# Patient Record
Sex: Female | Born: 1995 | Race: White | Hispanic: No | Marital: Single | Smoking: Never smoker
Health system: Southern US, Community
[De-identification: ages and names within clinical notes are randomized; demographics above are authoritative.]

## PROBLEM LIST (undated history)

## (undated) HISTORY — PX: BREAST REDUCTION SURGERY: SHX8

---

## 2016-12-17 ENCOUNTER — Encounter: Payer: Self-pay | Admitting: Sports Medicine

## 2016-12-17 ENCOUNTER — Ambulatory Visit
Admission: RE | Admit: 2016-12-17 | Discharge: 2016-12-17 | Disposition: A | Discharge status: Home / Self Care | Payer: BLUE CROSS/BLUE SHIELD | Source: Ambulatory Visit | Attending: Sports Medicine | Admitting: Sports Medicine

## 2016-12-17 ENCOUNTER — Ambulatory Visit (INDEPENDENT_AMBULATORY_CARE_PROVIDER_SITE_OTHER): Payer: BLUE CROSS/BLUE SHIELD | Admitting: Sports Medicine

## 2016-12-17 VITALS — BP 100/74 | Ht 64.0 in | Wt 150.0 lb

## 2016-12-17 DIAGNOSIS — R202 Paresthesia of skin: Secondary | ICD-10-CM | POA: Diagnosis not present

## 2016-12-17 DIAGNOSIS — M25562 Pain in left knee: Secondary | ICD-10-CM

## 2016-12-17 DIAGNOSIS — M25561 Pain in right knee: Secondary | ICD-10-CM

## 2016-12-17 DIAGNOSIS — R200 Anesthesia of skin: Secondary | ICD-10-CM

## 2016-12-17 NOTE — Progress Notes (Signed)
   Subjective:    Kayla Phelps - 21 y.o. female MRN [id number]  Date of birth: 10/20/1995  CC: Bilateral leg pain  HPI:  Kayla Phelps is a 21 y/o female complaining of bilateral leg pain that has worsened over the past three months. She states that she experiences pain on her shins after prolonged walking or running. She endorses weakness, numbness, feeling cold, and cramping. Her symptoms alleviate with rest. She has tried PT, ice, and stretching without any significant relief. Recently over the past week, she also noticed numbness and tingling in her arms bilaterally while running. She has underwent significant workup with Dr. Fredderick SeveranceJohn Tipton and was evaluated and treated for shin splints and chronic compartment syndrome with no benefit. She used to run cross country in high school and currently runs about two miles 5 to 6 days a week and would like to continue to do that. She is here today with her mom.  ROS: Denies fevers, chills, or weight loss. Negative except per HPI.  PMH: None Past Surgical Hx: None Social Hx: [employer] at [employer]    Objective:   Physical Exam BP 100/74   Ht 5\' 4"  (1.626 m)   Wt 150 lb (68 kg)   LMP 12/10/2016   BMI 25.75 kg/m  Gen: NAD, alert, cooperative with exam, well-appearing Skin: no rashes, normal turgor Psych: good insight, alert and oriented Neuro: CN II -XII intact grossly; Strength 5/5 throughout; Sensation intact grossly; +2 reflexes bilaterally Knee: No erythema, effusion or obvious bony deformities;Mild TTP over anterior shin bilaterally; No patellar or joint line tenderness; FROM upon flexion and extension; ACL, PCL, MCL, and LCL intact; Negative McMurray's and Thessaly's tests; normal strength in quadriceps and hamstrings Foot: Pes cavus noted; mild pronation while running  Assessment & Plan:  Kayla Phelps is a 21 y/o female complaining of worsening bilateral leg pain.  Her symptoms do not fit a particular disease process or etiology  and workup for shin splints and compartment syndrome has been negative. She tends to pronate while jogging and has pes cavus feet so will try placing scaphoid pads to see if there is any improvement in her symptoms. I recommend getting an L Spine Xray and then based on the findings will consider MRI since she neurological symptoms of numbness and tingling.  -Scaphoid pad insertions -L Spin Xray; possible MRI if necessary -F/u in 2-3 weeks  I personally was present and performed or re-performed the history, physical exam and medical decision-making activities of this service and have verified that the service and findings are accurately documented in the student's note. Etiology of this patient's symptoms is straightforward. Her symptoms certainly sound neurological. Her x-rays of her lumbar spine are normal. She has had 2 months of physical therapy without improvement in symptoms. I think we should get an MRI of her lumbar spine specifically to rule out significant spinal stenosis which may be causing her symptoms. Follow-up after that study to delineate further treatment. We may need to elicit the input of neurology at some point as well especially if her upper extremity symptoms worsen.

## 2016-12-22 NOTE — Addendum Note (Signed)
Addended by: Annita BrodMOORE, Alaa Eyerman C on: 12/22/2016 02:10 PM   Modules accepted: Orders

## 2016-12-31 ENCOUNTER — Other Ambulatory Visit: Payer: BLUE CROSS/BLUE SHIELD

## 2017-01-09 ENCOUNTER — Ambulatory Visit
Admission: RE | Admit: 2017-01-09 | Discharge: 2017-01-09 | Disposition: A | Discharge status: Home / Self Care | Payer: BLUE CROSS/BLUE SHIELD | Source: Ambulatory Visit | Attending: Sports Medicine | Admitting: Sports Medicine

## 2017-01-09 DIAGNOSIS — R200 Anesthesia of skin: Secondary | ICD-10-CM

## 2017-01-09 DIAGNOSIS — R202 Paresthesia of skin: Principal | ICD-10-CM

## 2017-01-21 ENCOUNTER — Telehealth: Payer: Self-pay | Admitting: Sports Medicine

## 2017-01-21 NOTE — Telephone Encounter (Addendum)
-----   Message from Annita BrodNeeton C Moore, [place] sent at 01/19/2017 11:21 AM EDT ----- Regarding: FW: mri results   ----- Message ----- From: Lizbeth Barkeresi, Melanie L Sent: 01/19/2017  10:10 AM To: Annita BrodNeeton C Moore, CMA Subject: mri results                                    Pt had mri on 6/30  I spoke with the patient on the phone yesterday after reviewing her MRI of her lumbar spine. She has some mild degenerative changes but does not have any evidence of significant spinal stenosis to explain her bilateral lower extremity numbness and pain with running. I think we should go ahead and rule out exercise induced compartment syndrome. I will refer her to Dr. Dion SaucierLandau for this. If that study is unremarkable then I would consider referral to vascular to rule out a possible exercise induced vascular occlusion.

## 2017-01-21 NOTE — Telephone Encounter (Signed)
Spoke with Diannia RuderKara from Dr Shelba FlakeLandau's office and she will call patient to set up compartment testing time and date

## 2018-02-08 IMAGING — MR MR LUMBAR SPINE W/O CM
4 of 5 series · 27 of 48 positions shown · non-contrast
Comparison: Lumbar radiographs 12/17/2016

CLINICAL DATA: 21-year-old female with 4 months of severe pain
radiating to the bilateral legs below the knees. Numbness and
tingling in both legs. Radicular symptoms.

EXAM:
MRI LUMBAR SPINE WITHOUT CONTRAST
TECHNIQUE: Multiplanar, multisequence MR imaging of the lumbar spine was
performed. No intravenous contrast was administered.

[Series 3: T2 · sagittal · 4.0mm · 0.55mm/px · 5 of 13 slices shown (1 of 2)]
[im 1/13]
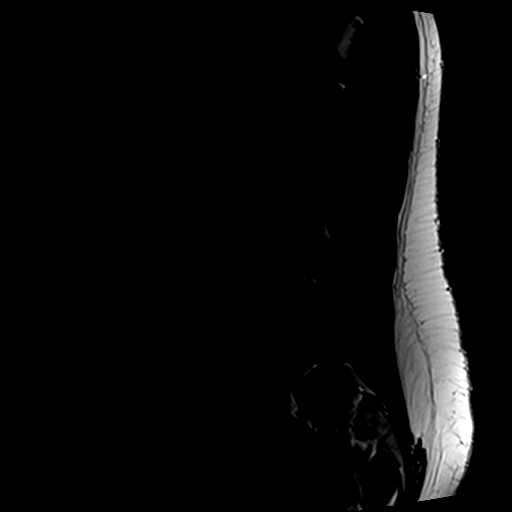
[im 4/13]
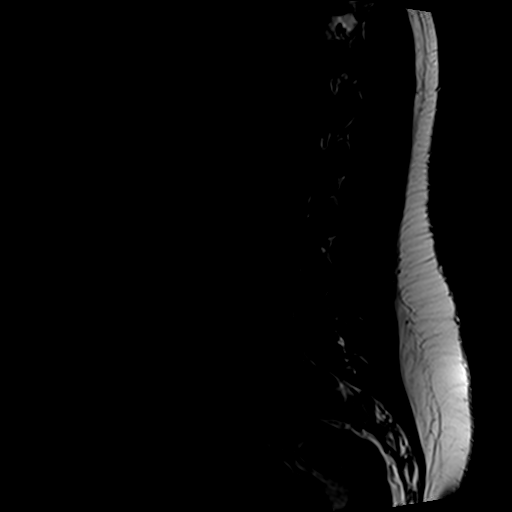
[im 7/13]
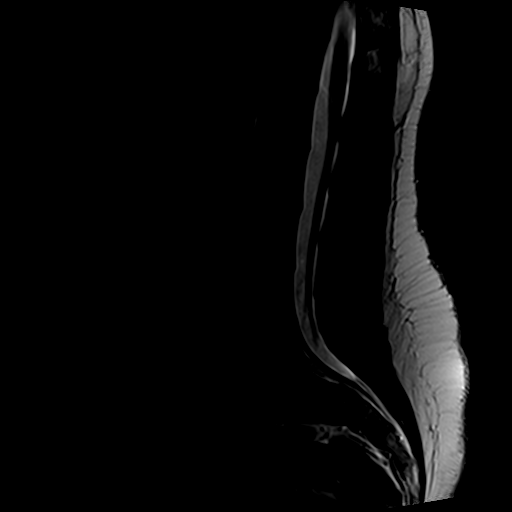
[im 10/13]
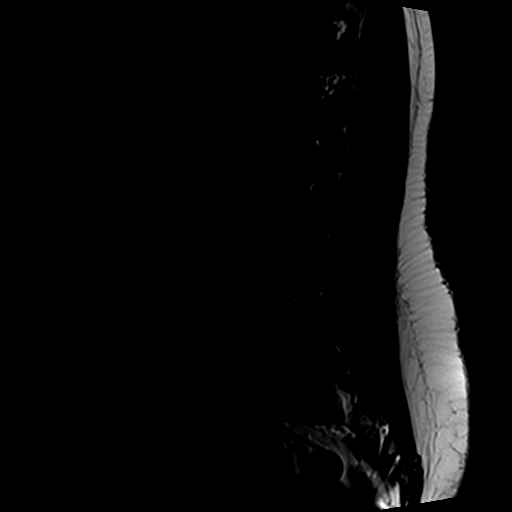
[im 13/13]
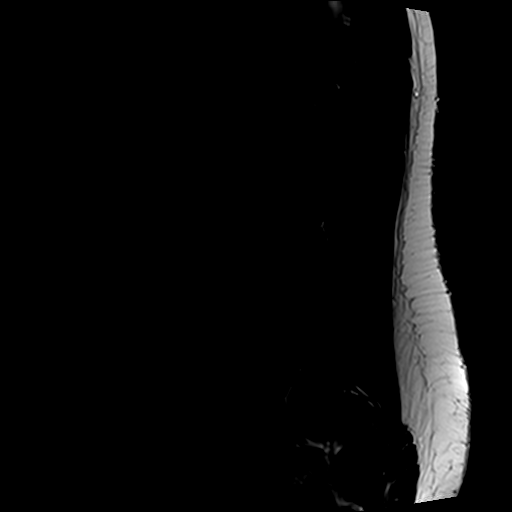

[Series 4: T1 · sagittal · 4.0mm · 0.55mm/px · 5 of 13 slices shown (1 of 2)]
[im 1/13]
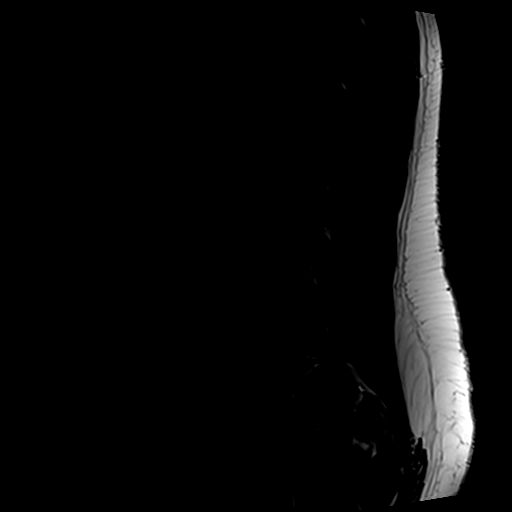
[im 4/13]
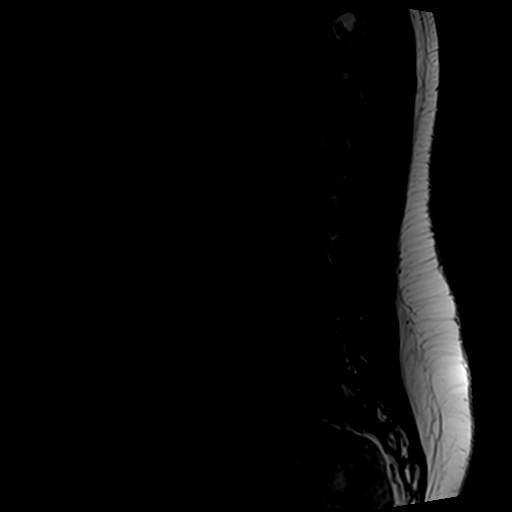
[im 7/13]
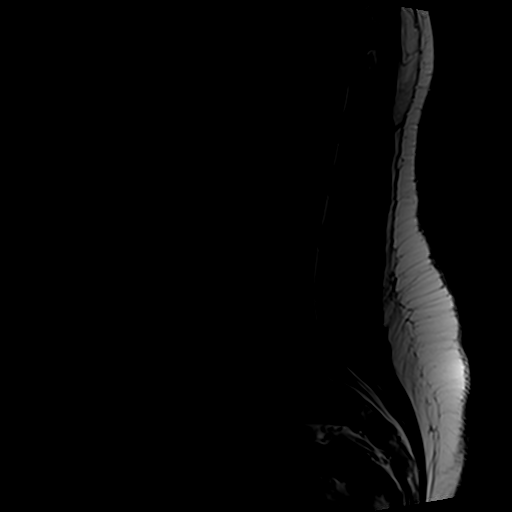
[im 10/13]
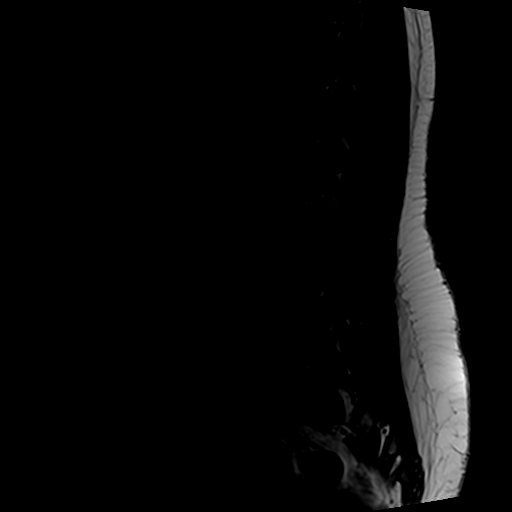
[im 13/13]
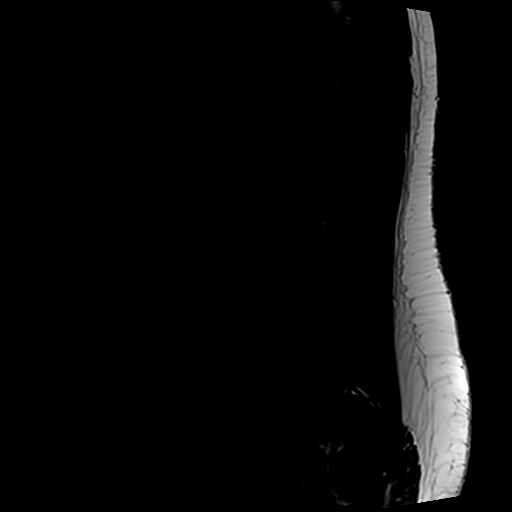

[Series 6: T2 · axial · 4.0mm · 0.70mm/px · z∈[-69,+120]mm · 10 of 37 slices shown (2 of 2)]
[im 3/37]
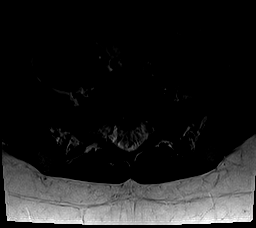
[im 5/37]
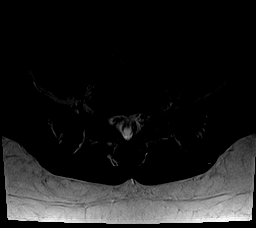
[im 8/37]
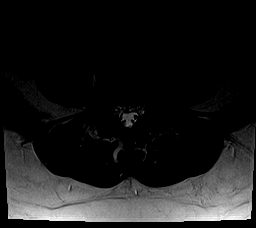
[im 13/37]
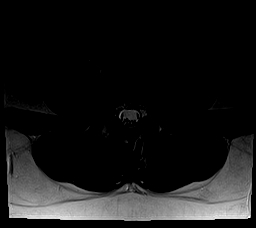
[im 17/37]
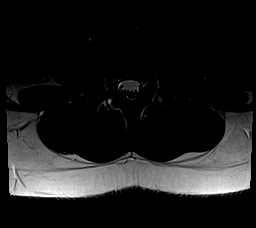
[im 20/37]
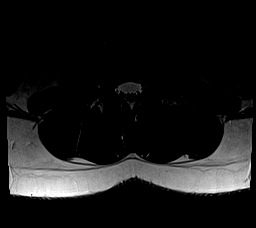
[im 22/37]
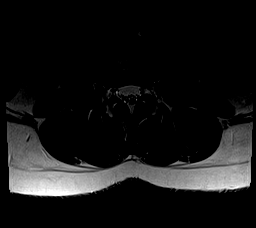
[im 27/37]
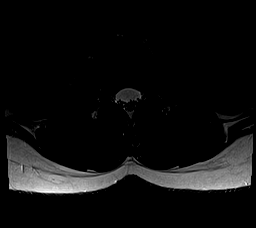
[im 32/37]
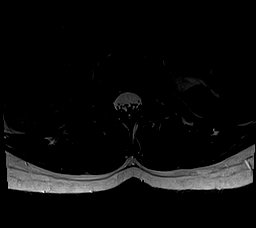
[im 37/37]
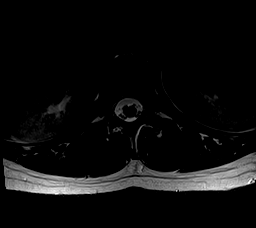

[Series 7: T1 · axial · 4.0mm · 0.35mm/px · z∈[-69,+95]mm · 7 of 37 slices shown (2 of 2)]
[im 3/37]
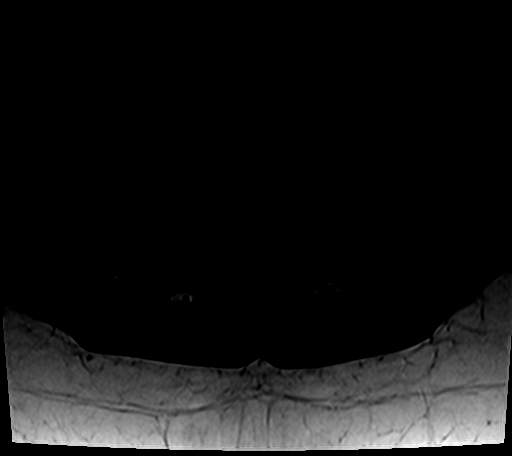
[im 5/37]
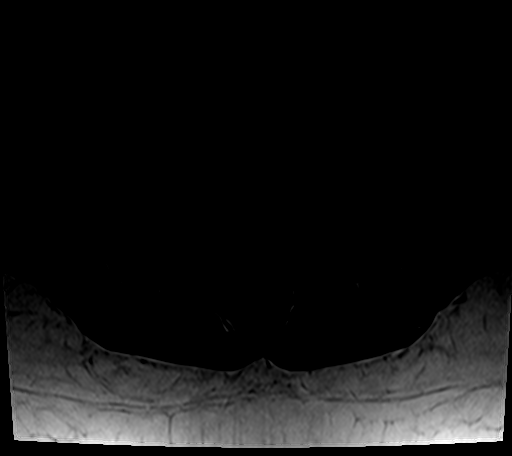
[im 8/37]
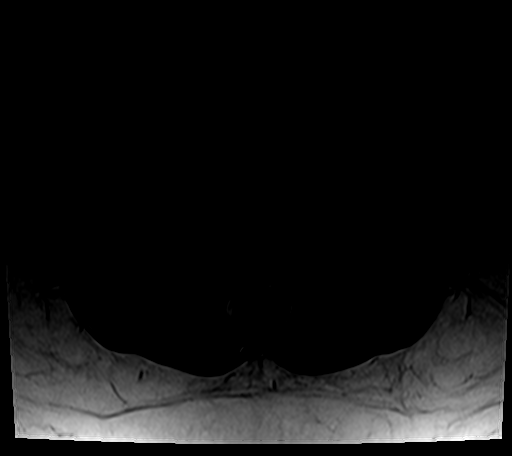
[im 13/37]
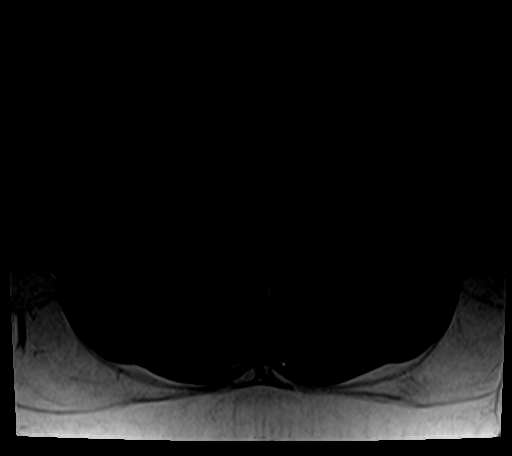
[im 17/37]
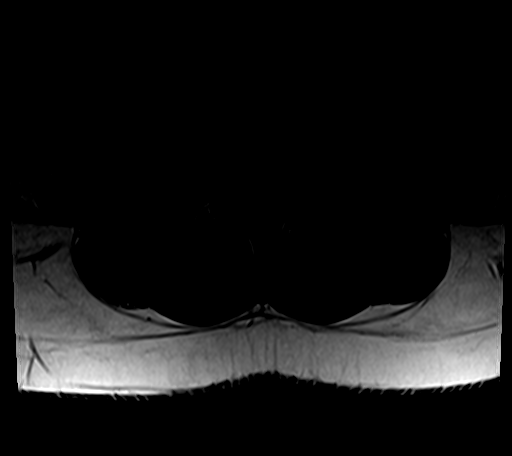
[im 20/37]
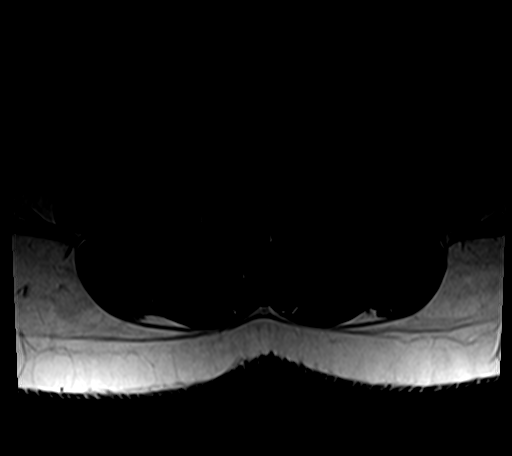
[im 32/37]
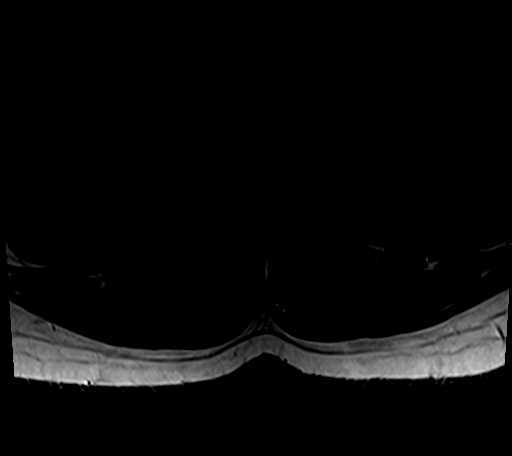

[27 of 48 positions shown; findings below may reference images not displayed]

FINDINGS: Segmentation:  Normal as seen on the comparison radiographs.

Alignment: Stable and largely normal vertebral height and alignment.
There is mildly exaggerated lordosis at L5-S1.

Vertebrae: No marrow edema or evidence of acute osseous abnormality.
Visualized bone marrow signal is within normal limits. Intact
visible sacrum and SI joints.

Conus medullaris: Extends to the T12-L1 level and appears normal.

Paraspinal and other soft tissues: Visualized abdominal viscera and
paraspinal soft tissues are within normal limits.

Disc levels:

T11-T12:  Negative.

T12-L1:  Negative.

L1-L2:  Negative.

L2-L3:  Negative.

L3-L4:  Negative aside from borderline to mild facet hypertrophy.

L4-L5: Subtle right paracentral disc protrusion (series 6, image 27
and series 3, image 6). Mild facet and ligament flavum hypertrophy.
No spinal stenosis. No lateral recess or foraminal stenosis.

L5-S1: Mild facet hypertrophy greater on the right. Mild epidural
lipomatosis. No stenosis.
IMPRESSION: 1. Isolated mild lumbar disc degeneration at L4-L5. There is a
subtle right paracentral disc herniation which might be a source for
L5 radiculitis, but there is no associated direct neural impingement
or spinal stenosis.
2. Mild degenerative lumbar facet hypertrophy at L4-L5 and L5-S1.

## 2019-09-05 ENCOUNTER — Encounter (HOSPITAL_COMMUNITY): Payer: Self-pay | Admitting: Family Medicine

## 2019-09-05 ENCOUNTER — Other Ambulatory Visit: Payer: Self-pay

## 2019-09-05 ENCOUNTER — Emergency Department (HOSPITAL_COMMUNITY)
Admission: EM | Admit: 2019-09-05 | Discharge: 2019-09-05 | Disposition: A | Discharge status: Home / Self Care | Payer: BC Managed Care – PPO | Attending: Emergency Medicine | Admitting: Emergency Medicine

## 2019-09-05 DIAGNOSIS — M542 Cervicalgia: Secondary | ICD-10-CM | POA: Diagnosis present

## 2019-09-05 DIAGNOSIS — M62838 Other muscle spasm: Secondary | ICD-10-CM | POA: Diagnosis not present

## 2019-09-05 MED ORDER — IBUPROFEN 600 MG PO TABS: 600.0000 mg | ORAL_TABLET | Freq: Four times a day (QID) | ORAL | 0 refills | Status: AC | PRN

## 2019-09-05 MED ORDER — IBUPROFEN 200 MG PO TABS
600.0000 mg | ORAL_TABLET | Freq: Once | ORAL | Status: AC
  Administered 2019-09-05: 600 mg via ORAL
  Filled 2019-09-05: qty 3

## 2019-09-05 MED ORDER — METHOCARBAMOL 500 MG PO TABS: 500.0000 mg | ORAL_TABLET | Freq: Two times a day (BID) | ORAL | 0 refills | Status: AC

## 2019-09-05 MED ORDER — LIDOCAINE 5 % EX PTCH: 1.0000 | MEDICATED_PATCH | CUTANEOUS | 0 refills | Status: AC

## 2019-09-05 MED ORDER — DIAZEPAM 5 MG PO TABS
5.0000 mg | ORAL_TABLET | Freq: Once | ORAL | Status: AC
  Administered 2019-09-05: 5 mg via ORAL
  Filled 2019-09-05: qty 1

## 2019-09-05 NOTE — ED Triage Notes (Signed)
Patient is complaining of left neck pain with a "pop" while she was sleeping last night. Also, complains of pain radiating to the left arm with no numbness or tingling.

## 2019-09-05 NOTE — ED Provider Notes (Signed)
[facility]-EMERGENCY DEPT Provider Note   CSN: [id number] Arrival date & time: 09/05/19  1043     History Chief Complaint  Patient presents with  . Neck Pain    Kayla Phelps is a 24 y.o. female.  HPI      Kayla Phelps is a 24 y.o. female, patient with no pertinent past medical history, presenting to the ED with neck pain beginning early this morning.  Patient states she shifted positions while sleeping and suddenly had pain to the left side of her neck.  This has caused pain and muscle spasms extending down the left side of the neck into the trapezius, and left upper back.  The pain was initially severe, now rated 4/10, described as a tightness with occasional spasms.  She has not had this issue before.  She has not taken any medications for this issue prior to arrival. Denies pregnancy.  LMP 08/31/2019. Denies fever/chills, difficulty swallowing, difficulty breathing, numbness, weakness, falls/trauma, dizziness, facial numbness, vision changes, or any other complaints.    History reviewed. No pertinent past medical history.  There are no problems to display for this patient.   Past Surgical History:  Procedure Laterality Date  . BREAST REDUCTION SURGERY       OB History   No obstetric history on file.     History reviewed. No pertinent family history.  Social History   Tobacco Use  . Smoking status: Never Smoker  . Smokeless tobacco: Never Used  Substance Use Topics  . Alcohol use: Yes    Comment: 1-2 months   . Drug use: Never    Home Medications Prior to Admission medications   Medication Sig Start Date End Date Taking? Authorizing Provider  ibuprofen (ADVIL) 600 MG tablet Take 1 tablet (600 mg total) by mouth every 6 (six) hours as needed. 09/05/19   Montasia Chisenhall C, PA-C  lidocaine (LIDODERM) 5 % Place 1 patch onto the skin daily. Remove & Discard patch within 12 hours or as directed by MD 09/05/19   Harolyn Rutherford C, PA-C  methocarbamol  (ROBAXIN) 500 MG tablet Take 1 tablet (500 mg total) by mouth 2 (two) times daily. 09/05/19   Marit Goodwill C, PA-C    Allergies    Cetirizine  Review of Systems   Review of Systems  Constitutional: Negative for chills and fever.  HENT: Negative for sore throat, trouble swallowing and voice change.   Eyes: Negative for visual disturbance.  Respiratory: Negative for shortness of breath.   Cardiovascular: Negative for chest pain.  Gastrointestinal: Negative for nausea and vomiting.  Musculoskeletal: Positive for neck pain.  Neurological: Negative for dizziness, syncope, facial asymmetry, speech difficulty, weakness, numbness and headaches.    Physical Exam Updated Vital Signs BP (!) 132/91 (BP Location: Left Arm)   Pulse 80   Temp 98.9 F (37.2 C) (Oral)   Resp 18   Ht 5\' 3"  (1.6 m)   Wt 65.8 kg   LMP 08/31/2019   SpO2 100%   BMI 25.69 kg/m   Physical Exam Vitals and nursing note reviewed.  Constitutional:      General: She is not in acute distress.    Appearance: She is well-developed. She is not diaphoretic.  HENT:     Head: Normocephalic and atraumatic.     Mouth/Throat:     Mouth: Mucous membranes are moist.     Pharynx: Oropharynx is clear.  Eyes:     Conjunctiva/sclera: Conjunctivae normal.  Cardiovascular:  Rate and Rhythm: Normal rate and regular rhythm.     Pulses: Normal pulses.          Radial pulses are 2+ on the right side and 2+ on the left side.     Comments: Tactile temperature in the extremities appropriate and equal bilaterally. Pulmonary:     Effort: Pulmonary effort is normal. No respiratory distress.     Breath sounds: Normal breath sounds.  Abdominal:     Tenderness: There is no guarding.  Musculoskeletal:     Cervical back: Neck supple.     Comments: Patient notes some stiffness to her neck and hesitates to move it laterally. Tenderness to the left cervical musculature into the left trapezius and the left upper back musculature. Full range  of motion in the left shoulder with some associated pain. No midline spinal tenderness.  Lymphadenopathy:     Cervical: No cervical adenopathy.  Skin:    General: Skin is warm and dry.  Neurological:     Mental Status: She is alert.     Comments: Sensation grossly intact to light touch through each of the nerve distributions of the bilateral upper extremities. Abduction and adduction of the fingers intact against resistance. Grip strength equal bilaterally. Supination and pronation intact against resistance. Strength 5/5 through the cardinal directions of the bilateral wrists. Strength 5/5 with flexion and extension of the bilateral elbows. Patient can touch the thumb to each one of the fingertips without difficulty.  Patient can hold the "OK" sign against resistance.  Psychiatric:        Mood and Affect: Mood and affect normal.        Speech: Speech normal.        Behavior: Behavior normal.     ED Results / Procedures / Treatments   Labs (all labs ordered are listed, but only abnormal results are displayed) Labs Reviewed - No data to display  EKG None  Radiology No results found.  Procedures Procedures (including critical care time)  Medications Ordered in ED Medications  ibuprofen (ADVIL) tablet 600 mg (600 mg Oral Given 09/05/19 1139)  diazepam (VALIUM) tablet 5 mg (5 mg Oral Given 09/05/19 1139)    ED Course  I have reviewed the triage vital signs and the nursing notes.  Pertinent labs & imaging results that were available during my care of the patient were reviewed by me and considered in my medical decision making (see chart for details).  Clinical Course as of Sep 04 1209  Tue Sep 05, 2019  1208 Patient states she feels much better.  She has almost complete range of motion in her neck.  She has full range of motion in her upper extremities.   [SJ]    Clinical Course User Index [SJ] Masyn Fullam, Hillard Danker, PA-C   MDM Rules/Calculators/A&P                       Patient presents with muscle spasms in the neck.  No focal neurologic deficits.  No trauma.  Improved during ED course. The patient was given instructions for home care as well as return precautions. Patient voices understanding of these instructions, accepts the plan, and is comfortable with discharge.     Final Clinical Impression(s) / ED Diagnoses Final diagnoses:  Muscle spasms of neck    Rx / DC Orders ED Discharge Orders         Ordered    methocarbamol (ROBAXIN) 500 MG tablet  2 times daily  09/05/19 1210    ibuprofen (ADVIL) 600 MG tablet  Every 6 hours PRN     09/05/19 1210    lidocaine (LIDODERM) 5 %  Every 24 hours     09/05/19 1211           Layla Maw 09/05/19 1212    Charlesetta Shanks, MD 09/06/19 [phone]

## 2019-09-05 NOTE — Discharge Instructions (Signed)
